# Patient Record
Sex: Male | Born: 1965 | Race: White | Hispanic: No | Marital: Married | Smoking: Never smoker
Health system: Southern US, Community
[De-identification: ages and names within clinical notes are randomized; demographics above are authoritative.]

## PROBLEM LIST (undated history)

## (undated) DIAGNOSIS — G4730 Sleep apnea, unspecified: Secondary | ICD-10-CM

## (undated) DIAGNOSIS — F32A Depression, unspecified: Secondary | ICD-10-CM

## (undated) DIAGNOSIS — F329 Major depressive disorder, single episode, unspecified: Secondary | ICD-10-CM

## (undated) HISTORY — DX: Depression, unspecified: F32.A

## (undated) HISTORY — DX: Major depressive disorder, single episode, unspecified: F32.9

## (undated) HISTORY — DX: Sleep apnea, unspecified: G47.30

## (undated) HISTORY — PX: NO PAST SURGERIES: SHX2092

---

## 1999-12-05 ENCOUNTER — Encounter: Payer: Self-pay | Admitting: Emergency Medicine

## 1999-12-05 ENCOUNTER — Emergency Department (HOSPITAL_COMMUNITY): Admission: EM | Admit: 1999-12-05 | Discharge: 1999-12-05 | Payer: Self-pay | Admitting: Emergency Medicine

## 2006-10-22 ENCOUNTER — Emergency Department (HOSPITAL_COMMUNITY): Admission: EM | Admit: 2006-10-22 | Discharge: 2006-10-22 | Payer: Self-pay | Admitting: Emergency Medicine

## 2015-10-24 DIAGNOSIS — J111 Influenza due to unidentified influenza virus with other respiratory manifestations: Secondary | ICD-10-CM | POA: Diagnosis not present

## 2015-10-30 DIAGNOSIS — J111 Influenza due to unidentified influenza virus with other respiratory manifestations: Secondary | ICD-10-CM | POA: Diagnosis not present

## 2015-10-30 DIAGNOSIS — J209 Acute bronchitis, unspecified: Secondary | ICD-10-CM | POA: Diagnosis not present

## 2015-12-19 DIAGNOSIS — G4733 Obstructive sleep apnea (adult) (pediatric): Secondary | ICD-10-CM | POA: Diagnosis not present

## 2015-12-28 DIAGNOSIS — E663 Overweight: Secondary | ICD-10-CM | POA: Diagnosis not present

## 2015-12-28 DIAGNOSIS — G4733 Obstructive sleep apnea (adult) (pediatric): Secondary | ICD-10-CM | POA: Diagnosis not present

## 2015-12-28 DIAGNOSIS — F329 Major depressive disorder, single episode, unspecified: Secondary | ICD-10-CM | POA: Diagnosis not present

## 2015-12-28 DIAGNOSIS — E785 Hyperlipidemia, unspecified: Secondary | ICD-10-CM | POA: Diagnosis not present

## 2016-02-06 DIAGNOSIS — M542 Cervicalgia: Secondary | ICD-10-CM | POA: Diagnosis not present

## 2016-02-06 DIAGNOSIS — M9903 Segmental and somatic dysfunction of lumbar region: Secondary | ICD-10-CM | POA: Diagnosis not present

## 2016-02-06 DIAGNOSIS — M545 Low back pain: Secondary | ICD-10-CM | POA: Diagnosis not present

## 2016-02-06 DIAGNOSIS — M9902 Segmental and somatic dysfunction of thoracic region: Secondary | ICD-10-CM | POA: Diagnosis not present

## 2016-04-18 DIAGNOSIS — M542 Cervicalgia: Secondary | ICD-10-CM | POA: Diagnosis not present

## 2016-04-18 DIAGNOSIS — M9902 Segmental and somatic dysfunction of thoracic region: Secondary | ICD-10-CM | POA: Diagnosis not present

## 2016-04-18 DIAGNOSIS — M545 Low back pain: Secondary | ICD-10-CM | POA: Diagnosis not present

## 2016-04-18 DIAGNOSIS — M9903 Segmental and somatic dysfunction of lumbar region: Secondary | ICD-10-CM | POA: Diagnosis not present

## 2016-04-19 DIAGNOSIS — M9903 Segmental and somatic dysfunction of lumbar region: Secondary | ICD-10-CM | POA: Diagnosis not present

## 2016-04-19 DIAGNOSIS — M545 Low back pain: Secondary | ICD-10-CM | POA: Diagnosis not present

## 2016-04-19 DIAGNOSIS — M542 Cervicalgia: Secondary | ICD-10-CM | POA: Diagnosis not present

## 2016-04-19 DIAGNOSIS — M9902 Segmental and somatic dysfunction of thoracic region: Secondary | ICD-10-CM | POA: Diagnosis not present

## 2016-05-03 DIAGNOSIS — Z23 Encounter for immunization: Secondary | ICD-10-CM | POA: Diagnosis not present

## 2016-05-03 DIAGNOSIS — Z0000 Encounter for general adult medical examination without abnormal findings: Secondary | ICD-10-CM | POA: Diagnosis not present

## 2016-05-03 DIAGNOSIS — Z1211 Encounter for screening for malignant neoplasm of colon: Secondary | ICD-10-CM | POA: Diagnosis not present

## 2016-05-03 DIAGNOSIS — Z125 Encounter for screening for malignant neoplasm of prostate: Secondary | ICD-10-CM | POA: Diagnosis not present

## 2016-05-15 DIAGNOSIS — M545 Low back pain: Secondary | ICD-10-CM | POA: Diagnosis not present

## 2016-05-15 DIAGNOSIS — M9902 Segmental and somatic dysfunction of thoracic region: Secondary | ICD-10-CM | POA: Diagnosis not present

## 2016-05-15 DIAGNOSIS — M9903 Segmental and somatic dysfunction of lumbar region: Secondary | ICD-10-CM | POA: Diagnosis not present

## 2016-05-15 DIAGNOSIS — M542 Cervicalgia: Secondary | ICD-10-CM | POA: Diagnosis not present

## 2016-05-17 DIAGNOSIS — F329 Major depressive disorder, single episode, unspecified: Secondary | ICD-10-CM | POA: Diagnosis not present

## 2016-05-17 DIAGNOSIS — E785 Hyperlipidemia, unspecified: Secondary | ICD-10-CM | POA: Diagnosis not present

## 2016-05-22 DIAGNOSIS — K219 Gastro-esophageal reflux disease without esophagitis: Secondary | ICD-10-CM | POA: Diagnosis not present

## 2016-05-25 DIAGNOSIS — G4733 Obstructive sleep apnea (adult) (pediatric): Secondary | ICD-10-CM | POA: Diagnosis not present

## 2016-06-04 DIAGNOSIS — D124 Benign neoplasm of descending colon: Secondary | ICD-10-CM | POA: Diagnosis not present

## 2016-06-04 DIAGNOSIS — K5730 Diverticulosis of large intestine without perforation or abscess without bleeding: Secondary | ICD-10-CM | POA: Diagnosis not present

## 2016-06-04 DIAGNOSIS — Z1211 Encounter for screening for malignant neoplasm of colon: Secondary | ICD-10-CM | POA: Diagnosis not present

## 2016-06-04 DIAGNOSIS — D122 Benign neoplasm of ascending colon: Secondary | ICD-10-CM | POA: Diagnosis not present

## 2016-07-10 DIAGNOSIS — D126 Benign neoplasm of colon, unspecified: Secondary | ICD-10-CM | POA: Diagnosis not present

## 2016-07-10 DIAGNOSIS — R1031 Right lower quadrant pain: Secondary | ICD-10-CM | POA: Diagnosis not present

## 2016-07-11 DIAGNOSIS — R1011 Right upper quadrant pain: Secondary | ICD-10-CM | POA: Diagnosis not present

## 2016-07-11 DIAGNOSIS — R109 Unspecified abdominal pain: Secondary | ICD-10-CM | POA: Diagnosis not present

## 2016-09-27 DIAGNOSIS — E785 Hyperlipidemia, unspecified: Secondary | ICD-10-CM | POA: Diagnosis not present

## 2016-09-27 DIAGNOSIS — F329 Major depressive disorder, single episode, unspecified: Secondary | ICD-10-CM | POA: Diagnosis not present

## 2016-10-14 DIAGNOSIS — M545 Low back pain: Secondary | ICD-10-CM | POA: Diagnosis not present

## 2016-10-14 DIAGNOSIS — M9905 Segmental and somatic dysfunction of pelvic region: Secondary | ICD-10-CM | POA: Diagnosis not present

## 2016-10-14 DIAGNOSIS — M9903 Segmental and somatic dysfunction of lumbar region: Secondary | ICD-10-CM | POA: Diagnosis not present

## 2016-10-14 DIAGNOSIS — M436 Torticollis: Secondary | ICD-10-CM | POA: Diagnosis not present

## 2016-11-29 DIAGNOSIS — G4733 Obstructive sleep apnea (adult) (pediatric): Secondary | ICD-10-CM | POA: Diagnosis not present

## 2017-04-21 DIAGNOSIS — Z23 Encounter for immunization: Secondary | ICD-10-CM | POA: Diagnosis not present

## 2017-06-07 ENCOUNTER — Emergency Department (HOSPITAL_COMMUNITY): Payer: BLUE CROSS/BLUE SHIELD

## 2017-06-07 ENCOUNTER — Encounter (HOSPITAL_COMMUNITY): Payer: Self-pay

## 2017-06-07 ENCOUNTER — Emergency Department (HOSPITAL_COMMUNITY)
Admission: EM | Admit: 2017-06-07 | Discharge: 2017-06-07 | Disposition: A | Discharge status: Home / Self Care | Payer: BLUE CROSS/BLUE SHIELD | Attending: Emergency Medicine | Admitting: Emergency Medicine

## 2017-06-07 ENCOUNTER — Other Ambulatory Visit: Payer: Self-pay

## 2017-06-07 DIAGNOSIS — R0600 Dyspnea, unspecified: Secondary | ICD-10-CM | POA: Diagnosis not present

## 2017-06-07 DIAGNOSIS — R0789 Other chest pain: Secondary | ICD-10-CM | POA: Diagnosis not present

## 2017-06-07 DIAGNOSIS — R0602 Shortness of breath: Secondary | ICD-10-CM | POA: Diagnosis not present

## 2017-06-07 LAB — BASIC METABOLIC PANEL
ANION GAP: 6 (ref 5–15)
BUN: 19 mg/dL (ref 6–20)
CO2: 29 mmol/L (ref 22–32)
Calcium: 9.6 mg/dL (ref 8.9–10.3)
Chloride: 101 mmol/L (ref 101–111)
Creatinine, Ser: 0.77 mg/dL (ref 0.61–1.24)
GFR calc Af Amer: >60 mL/min (ref >60)
GLUCOSE: 92 mg/dL (ref 65–99)
POTASSIUM: 3.7 mmol/L (ref 3.5–5.1)
SODIUM: 136 mmol/L (ref 135–145)

## 2017-06-07 LAB — CBC
HEMATOCRIT: 45.9 % (ref 39.0–52.0)
HEMOGLOBIN: 15.9 g/dL (ref 13.0–17.0)
MCH: 30.2 pg (ref 26.0–34.0)
MCHC: 34.6 g/dL (ref 30.0–36.0)
MCV: 87.1 fL (ref 78.0–100.0)
Platelets: 175 10*3/uL (ref 150–400)
RBC: 5.27 MIL/uL (ref 4.22–5.81)
RDW: 12.3 % (ref 11.5–15.5)
WBC: 5.9 10*3/uL (ref 4.0–10.5)

## 2017-06-07 LAB — I-STAT TROPONIN, ED: Troponin i, poc: 0 ng/mL (ref 0.00–0.08)

## 2017-06-07 MED ORDER — ALBUTEROL SULFATE HFA 108 (90 BASE) MCG/ACT IN AERS: 1.0000 | INHALATION_SPRAY | Freq: Four times a day (QID) | RESPIRATORY_TRACT | 0 refills | Status: DC | PRN

## 2017-06-07 MED ORDER — FLUTICASONE PROPIONATE HFA 110 MCG/ACT IN AERO: 1.0000 | INHALATION_SPRAY | Freq: Two times a day (BID) | RESPIRATORY_TRACT | 0 refills | Status: DC

## 2017-06-07 NOTE — ED Triage Notes (Signed)
Pt arrived POV c/o SOB x4 weeks intermittently and general chest tightness

## 2017-06-07 NOTE — ED Provider Notes (Signed)
[facility]ERGENCY [facility]MENT [facility]   CSN: [id number] Arrival date & time: 06/07/17  1639     History   Chief Complaint Chief Complaint  Patient presents with  . Shortness of Breath  . Chest Pain    HPI Anthony Crane is a 51 y.o. male. Chief complaint is difficulty getting a full breath.  HPI 51 year old male. Otherwise healthy. States for the last 1 month he has felt like he has difficulty "getting a full breath". He does not describe shortness of breath or air hunger. States that either at rest or with exertion but "all the time" he felt like he cannot fully get a deep breath in or out. He does limit his exertion but is present 24 hours a day. No cough. Normal wheezing. He does feel like there is a "constant tightness". No edema. No recent prolonged immobilization cast splints fractures surgeries malignancies or DVT or PE risk. No history of hypertension diabetes high cholesterol, lifetime nonsmoker, no family history of heart disease. No exertional or rest pain.  History of pectus excavatum. Sleep apnea, wears C Pap. No issues with his CPAP. Cleans it "religiously".  History reviewed. No pertinent past medical history.  There are no active problems to display for this patient.   History reviewed. No pertinent surgical history.     Home Medications    Prior to Admission medications   Medication Sig Start Date End Date Taking? Authorizing Provider  albuterol (PROVENTIL HFA;VENTOLIN HFA) 108 (90 Base) MCG/ACT inhaler Inhale 1-2 puffs every 6 (six) hours as needed into the lungs for wheezing. 06/07/17   Rolland PorterJames, Zamiyah Resendes, MD  fluticasone (FLOVENT HFA) 110 MCG/ACT inhaler Inhale 1 puff 2 (two) times daily into the lungs. 06/07/17   Rolland PorterJames, Priscila Bean, MD    Family History History reviewed. No pertinent family history.  Social History Social History   Tobacco Use  . Smoking status: Never Smoker  . Smokeless tobacco: Never Used  Substance Use Topics   . Alcohol use: No    Frequency: Never  . Drug use: No     Allergies   Hydrocodone   Review of Systems Review of Systems  Constitutional: Negative for appetite change, chills, diaphoresis, fatigue and fever.  HENT: Negative for mouth sores, sore throat and trouble swallowing.   Eyes: Negative for visual disturbance.  Respiratory: Positive for chest tightness and shortness of breath. Negative for cough and wheezing.   Cardiovascular: Negative for chest pain.  Gastrointestinal: Negative for abdominal distention, abdominal pain, diarrhea, nausea and vomiting.  Endocrine: Negative for polydipsia, polyphagia and polyuria.  Genitourinary: Negative for dysuria, frequency and hematuria.  Musculoskeletal: Negative for gait problem.  Skin: Negative for color change, pallor and rash.  Neurological: Negative for dizziness, syncope, light-headedness and headaches.  Hematological: Does not bruise/bleed easily.  Psychiatric/Behavioral: Negative for behavioral problems and confusion.     Physical Exam Updated Vital Signs BP (!) 144/93 (BP Location: Right Arm)   Pulse 64   Temp (!) 97.5 F (36.4 C) (Oral)   Resp 18   Ht 6' (1.829 m)   Wt 98.9 kg (218 lb)   SpO2 96%   BMI 29.57 kg/m   Physical Exam  Constitutional: He is oriented to person, place, and time. He appears well-developed and well-nourished. No distress.  HENT:  Head: Normocephalic.  Eyes: Conjunctivae are normal. Pupils are equal, round, and reactive to light. No scleral icterus.  Neck: Normal range of motion. Neck supple. No thyromegaly present.  Cardiovascular: Normal  rate and regular rhythm. Exam reveals no gallop and no friction rub.  No murmur heard. No gallop. No bruits. No murmurs. No peripheral edema. No asymmetric swelling of extremities. No cording.  Pulmonary/Chest: Effort normal and breath sounds normal. No respiratory distress. He has no wheezes. He has no rales.  Pectus excavatum. Clear lungs.  Abdominal:  Soft. Bowel sounds are normal. He exhibits no distension. There is no tenderness. There is no rebound.  Musculoskeletal: Normal range of motion.  Neurological: He is alert and oriented to person, place, and time.  Skin: Skin is warm and dry. No rash noted.  Psychiatric: He has a normal mood and affect. His behavior is normal.     ED Treatments / Results  Labs (all labs ordered are listed, but only abnormal results are displayed) Labs Reviewed  BASIC METABOLIC PANEL  CBC  I-STAT TROPONIN, ED    EKG  EKG Interpretation None       Radiology Dg Chest 2 View  Result Date: 06/07/2017 CLINICAL DATA:  Dyspnea x4 weeks in general chest tightness. EXAM: CHEST  2 VIEW COMPARISON:  12/05/1999 report FINDINGS: Pectus excavatum configuration of the anterior chest wall. No pneumonic consolidation, CHF, effusion or pneumothorax. Mild attenuation of the interstitial lung markings may reflect a component of hyperinflation and/or emphysema. IMPRESSION: 1. Pectus excavatum. 2. No active pulmonary disease. 3. Mild diffuse attenuation of the interstitial lung markings may represent emphysematous hyperinflation of the lungs. Electronically Signed   By: Tollie Ethavid  Kwon M.D.   On: 06/07/2017 17:11    Procedures Procedures (including critical care time)  Medications Ordered in ED Medications - No data to display   Initial Impression / Assessment and Plan / ED Course  I have reviewed the triage vital signs and the nursing notes.  Pertinent labs & imaging results that were available during my care of the patient were reviewed by me and considered in my medical decision making (see chart for details).   heart score of 1. Normal EKG and enzyme. No risks for DVT or PE. Is not hypoxemic or tachycardic. His symptoms sound like bronchospasm. He may be having Summers in shifted lung disease related to his 20 pound weight gain, perhaps from spasm, and his pectus excavatum. Referred back to his primary care for  palmar function testing. We'll try Flovent and broken dilators. Given lobe hour pulmonary if needed should his primary care request PFTs were formal evaluation.  Final Clinical Impressions(s) / ED Diagnoses   Final diagnoses:  Dyspnea, unspecified type    ED Discharge Orders        Ordered    albuterol (PROVENTIL HFA;VENTOLIN HFA) 108 (90 Base) MCG/ACT inhaler  Every 6 hours PRN     06/07/17 1950    fluticasone (FLOVENT HFA) 110 MCG/ACT inhaler  2 times daily     06/07/17 1950       Rolland PorterJames, Daylen Hack, MD 06/07/17 [phone]

## 2017-06-07 NOTE — Discharge Instructions (Signed)
Flovent inhaler twice per day. Albuterol inhaler 3 times per day and every 4 hours as needed. Follow-up with your primary care physician. If pulmonary referral is recommended by your primary care physician., this is included in these instructions.

## 2017-06-19 DIAGNOSIS — M9903 Segmental and somatic dysfunction of lumbar region: Secondary | ICD-10-CM | POA: Diagnosis not present

## 2017-06-19 DIAGNOSIS — M436 Torticollis: Secondary | ICD-10-CM | POA: Diagnosis not present

## 2017-06-19 DIAGNOSIS — M545 Low back pain: Secondary | ICD-10-CM | POA: Diagnosis not present

## 2017-06-19 DIAGNOSIS — M9905 Segmental and somatic dysfunction of pelvic region: Secondary | ICD-10-CM | POA: Diagnosis not present

## 2017-08-04 DIAGNOSIS — H5213 Myopia, bilateral: Secondary | ICD-10-CM | POA: Diagnosis not present

## 2017-09-09 DIAGNOSIS — M545 Low back pain: Secondary | ICD-10-CM | POA: Diagnosis not present

## 2017-09-09 DIAGNOSIS — M9905 Segmental and somatic dysfunction of pelvic region: Secondary | ICD-10-CM | POA: Diagnosis not present

## 2017-09-09 DIAGNOSIS — M9903 Segmental and somatic dysfunction of lumbar region: Secondary | ICD-10-CM | POA: Diagnosis not present

## 2017-09-09 DIAGNOSIS — M436 Torticollis: Secondary | ICD-10-CM | POA: Diagnosis not present

## 2017-09-10 DIAGNOSIS — M545 Low back pain: Secondary | ICD-10-CM | POA: Diagnosis not present

## 2017-09-10 DIAGNOSIS — M9903 Segmental and somatic dysfunction of lumbar region: Secondary | ICD-10-CM | POA: Diagnosis not present

## 2017-09-10 DIAGNOSIS — M9905 Segmental and somatic dysfunction of pelvic region: Secondary | ICD-10-CM | POA: Diagnosis not present

## 2017-09-10 DIAGNOSIS — M436 Torticollis: Secondary | ICD-10-CM | POA: Diagnosis not present

## 2017-09-11 DIAGNOSIS — M545 Low back pain: Secondary | ICD-10-CM | POA: Diagnosis not present

## 2017-09-11 DIAGNOSIS — M9905 Segmental and somatic dysfunction of pelvic region: Secondary | ICD-10-CM | POA: Diagnosis not present

## 2017-09-11 DIAGNOSIS — M436 Torticollis: Secondary | ICD-10-CM | POA: Diagnosis not present

## 2017-09-11 DIAGNOSIS — M9903 Segmental and somatic dysfunction of lumbar region: Secondary | ICD-10-CM | POA: Diagnosis not present

## 2017-12-03 IMAGING — DX DG CHEST 2V
2 series · 2 of 2 positions shown · non-contrast
Comparison: 12/05/1999 report

CLINICAL DATA: Dyspnea x4 weeks in general chest tightness.

EXAM:
CHEST  2 VIEW

[chest lat]
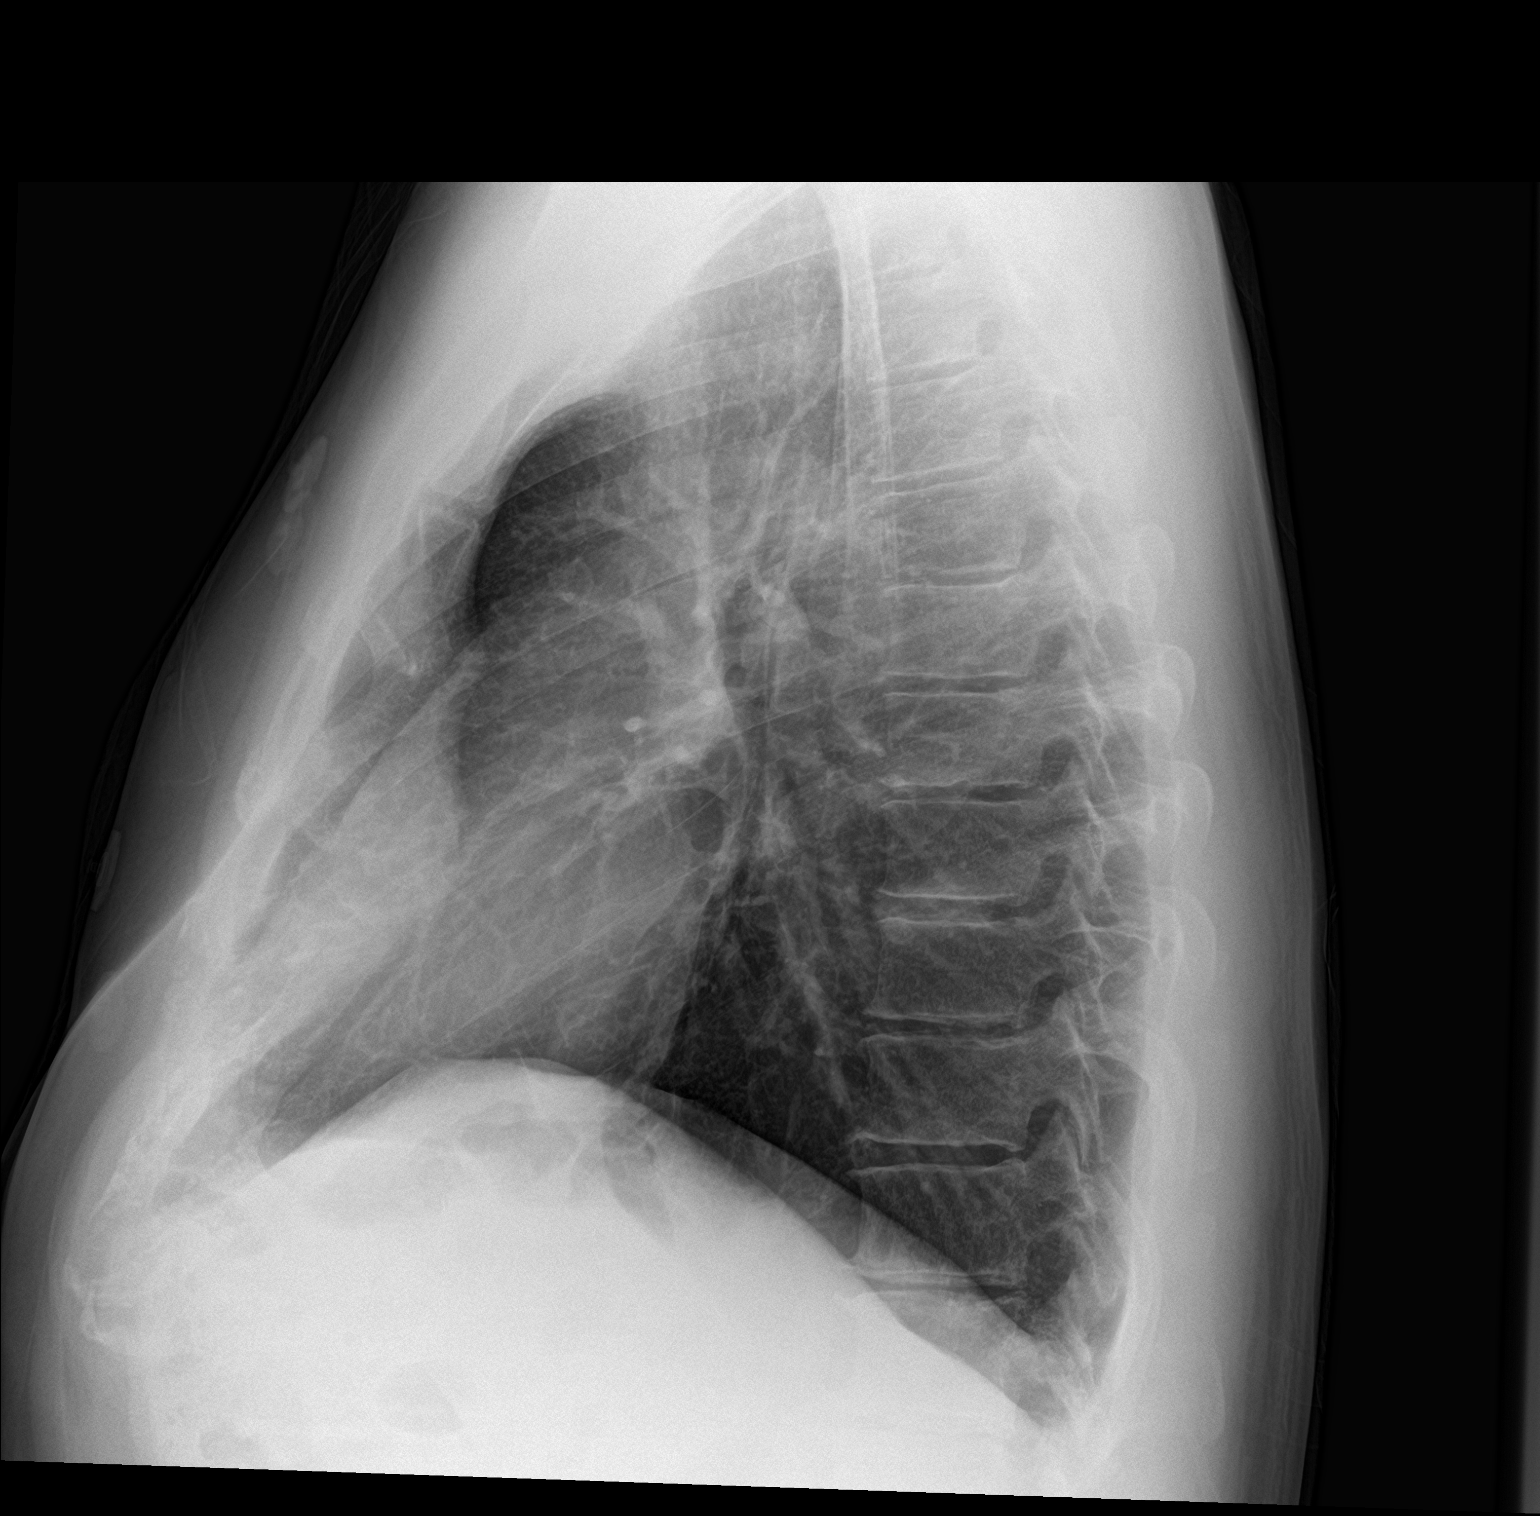

[chest pa]
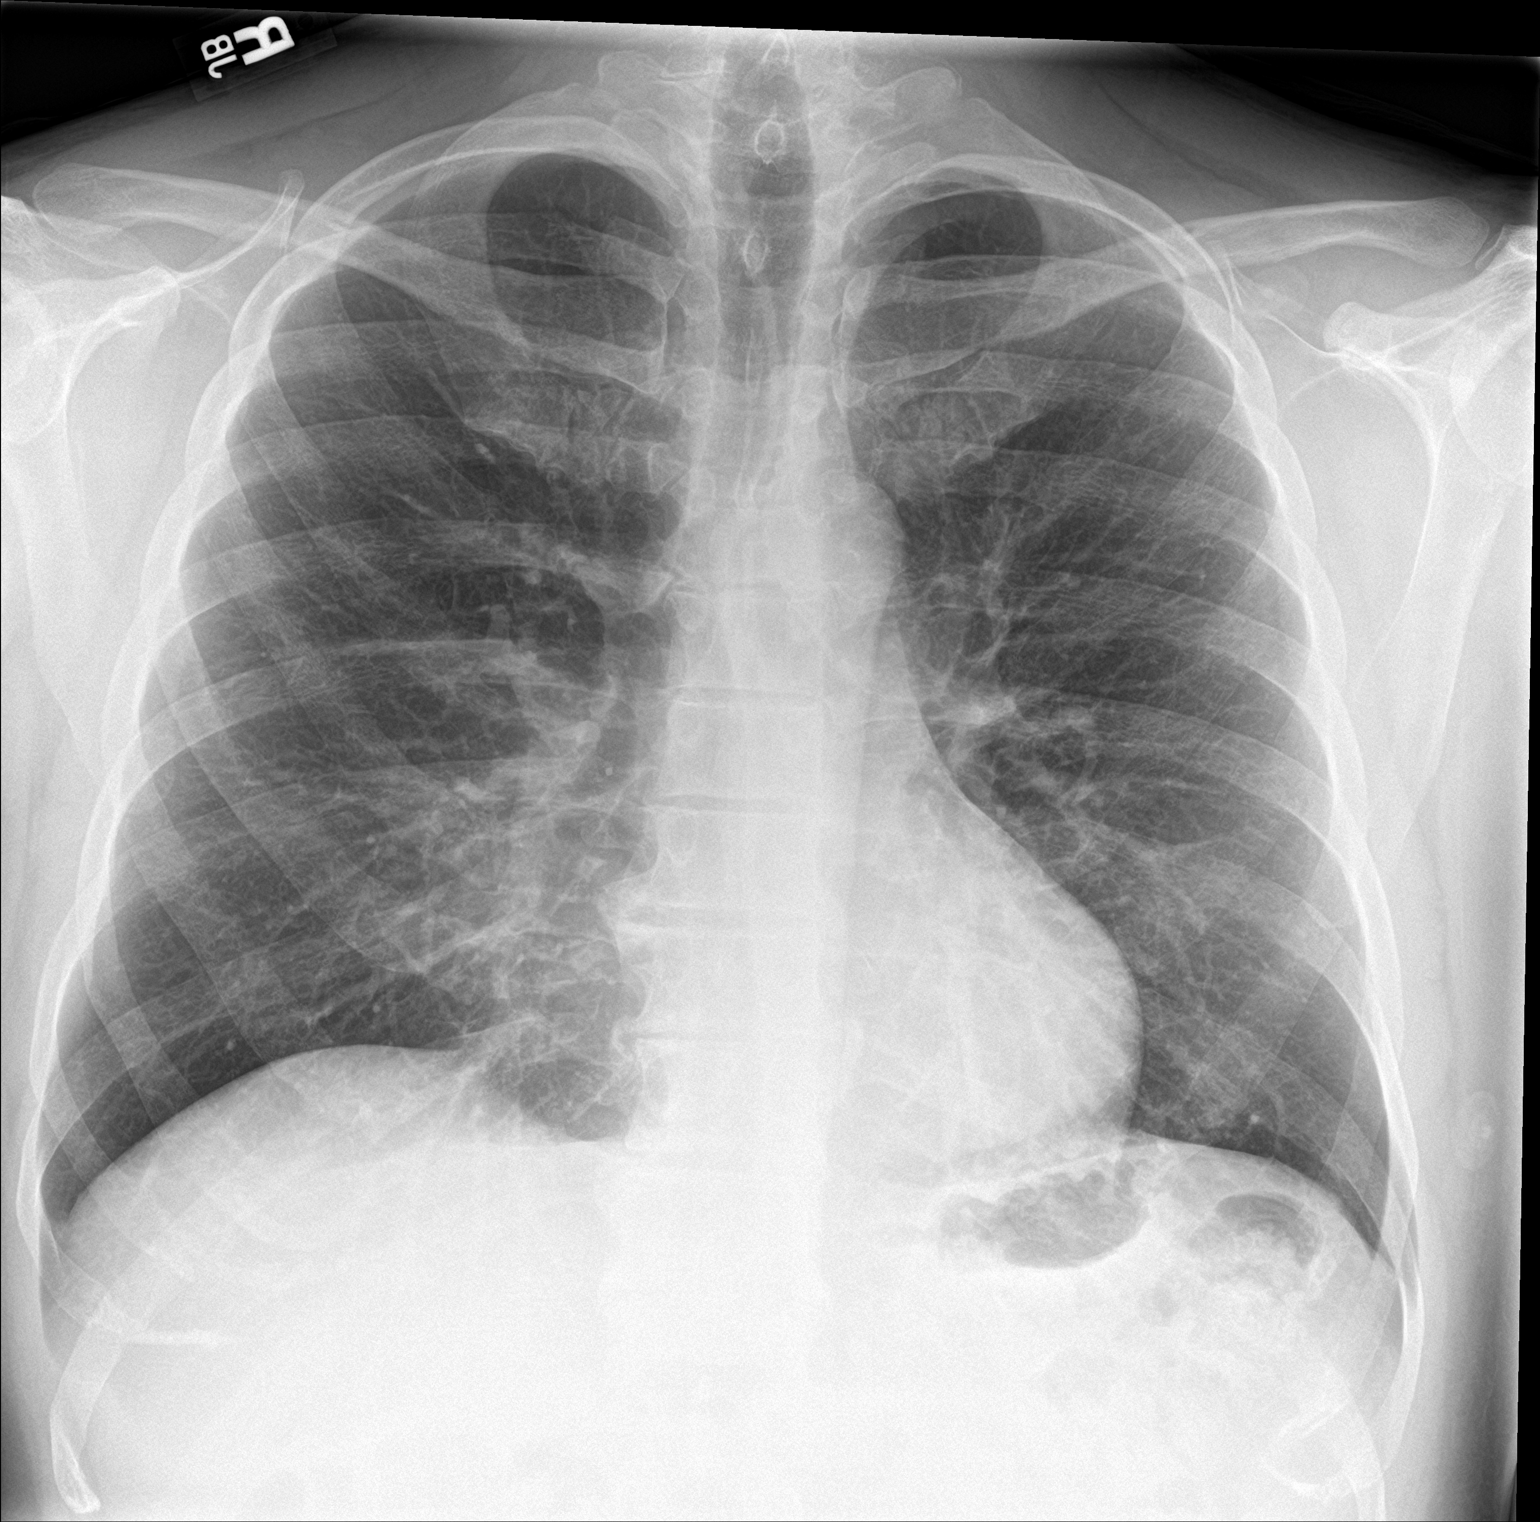

[2 of 2 positions shown; findings below may reference images not displayed]

FINDINGS: Pectus excavatum configuration of the anterior chest wall. No
pneumonic consolidation, CHF, effusion or pneumothorax. Mild
attenuation of the interstitial lung markings may reflect a
component of hyperinflation and/or emphysema.
IMPRESSION: 1. Pectus excavatum.
2. No active pulmonary disease.
3. Mild diffuse attenuation of the interstitial lung markings may
represent emphysematous hyperinflation of the lungs.

## 2017-12-24 DIAGNOSIS — G4733 Obstructive sleep apnea (adult) (pediatric): Secondary | ICD-10-CM | POA: Diagnosis not present

## 2018-03-25 DIAGNOSIS — L820 Inflamed seborrheic keratosis: Secondary | ICD-10-CM | POA: Diagnosis not present

## 2018-03-25 DIAGNOSIS — D225 Melanocytic nevi of trunk: Secondary | ICD-10-CM | POA: Diagnosis not present

## 2018-03-25 DIAGNOSIS — L814 Other melanin hyperpigmentation: Secondary | ICD-10-CM | POA: Diagnosis not present

## 2018-03-25 DIAGNOSIS — L578 Other skin changes due to chronic exposure to nonionizing radiation: Secondary | ICD-10-CM | POA: Diagnosis not present

## 2018-03-25 DIAGNOSIS — L309 Dermatitis, unspecified: Secondary | ICD-10-CM | POA: Diagnosis not present

## 2018-05-04 DIAGNOSIS — Z6831 Body mass index (BMI) 31.0-31.9, adult: Secondary | ICD-10-CM | POA: Diagnosis not present

## 2018-05-04 DIAGNOSIS — F419 Anxiety disorder, unspecified: Secondary | ICD-10-CM | POA: Diagnosis not present

## 2018-05-04 DIAGNOSIS — Z1331 Encounter for screening for depression: Secondary | ICD-10-CM | POA: Diagnosis not present

## 2018-05-04 DIAGNOSIS — Z23 Encounter for immunization: Secondary | ICD-10-CM | POA: Diagnosis not present

## 2018-08-14 DIAGNOSIS — M25461 Effusion, right knee: Secondary | ICD-10-CM | POA: Diagnosis not present

## 2018-08-14 DIAGNOSIS — M25561 Pain in right knee: Secondary | ICD-10-CM | POA: Diagnosis not present

## 2018-08-14 DIAGNOSIS — M25469 Effusion, unspecified knee: Secondary | ICD-10-CM | POA: Diagnosis not present

## 2018-08-18 ENCOUNTER — Encounter: Payer: BLUE CROSS/BLUE SHIELD | Admitting: Sports Medicine

## 2018-08-25 DIAGNOSIS — M25561 Pain in right knee: Secondary | ICD-10-CM | POA: Diagnosis not present

## 2018-08-28 DIAGNOSIS — M25561 Pain in right knee: Secondary | ICD-10-CM | POA: Diagnosis not present

## 2018-09-01 DIAGNOSIS — M25561 Pain in right knee: Secondary | ICD-10-CM | POA: Diagnosis not present

## 2018-09-01 DIAGNOSIS — M1711 Unilateral primary osteoarthritis, right knee: Secondary | ICD-10-CM | POA: Diagnosis not present

## 2018-09-08 DIAGNOSIS — M1711 Unilateral primary osteoarthritis, right knee: Secondary | ICD-10-CM | POA: Diagnosis not present

## 2018-09-18 ENCOUNTER — Ambulatory Visit (INDEPENDENT_AMBULATORY_CARE_PROVIDER_SITE_OTHER): Payer: BLUE CROSS/BLUE SHIELD | Admitting: Sports Medicine

## 2018-09-18 ENCOUNTER — Encounter: Payer: Self-pay | Admitting: Sports Medicine

## 2018-09-18 DIAGNOSIS — M1711 Unilateral primary osteoarthritis, right knee: Secondary | ICD-10-CM | POA: Diagnosis not present

## 2018-09-18 MED ORDER — CELECOXIB 200 MG PO CAPS
ORAL_CAPSULE | ORAL | 2 refills | Status: DC
Start: 2018-09-18 — End: 2019-01-10

## 2018-09-18 MED ORDER — TRAMADOL HCL 50 MG PO TABS: 50.0000 mg | ORAL_TABLET | Freq: Three times a day (TID) | ORAL | 0 refills | Status: DC | PRN

## 2018-09-18 NOTE — Assessment & Plan Note (Signed)
With degenerative meniscal tearing and a full-thickness focal area of cartilage loss of the medial femoral condyle. Previous injection did not help. We are going to repeated today, aspiration and injection but with ultrasound guidance. I am going to get him set up for Visco supplementation. Switching from ibuprofen to Celebrex and adding tramadol for breakthrough pain. Return to start viscosupplementation when approved.

## 2018-09-18 NOTE — Progress Notes (Signed)
Subjective:    CC: Right knee pain  HPI:  This is a pleasant 53 year old male, for some time now he has had pain that he localizes in his right knee, medial joint line, anteriorly.  No overt mechanical symptoms.  He was seen by [place] [facility], he was given an aspiration and injection, crystal analysis was negative, ultimately he continued to have pain, MRI was obtained that showed medial meniscal tearing, as well as full-thickness focal cartilage loss of the medial femoral condyle.  He is interested in additional options.  Using ibuprofen 800.  I reviewed the past medical history, family history, social history, surgical history, and allergies today and no changes were needed.  Please see the problem list section below in epic for further details.  Past Medical History: No past medical history on file. Past Surgical History: No past surgical history on file. Social History: Social History   Socioeconomic History  . Marital status: Married    Spouse name: Not on file  . Number of children: Not on file  . Years of education: Not on file  . Highest education level: Not on file  Occupational History  . Not on file  Social Needs  . Financial resource strain: Not on file  . Food insecurity:    Worry: Not on file    Inability: Not on file  . Transportation needs:    Medical: Not on file    Non-medical: Not on file  Tobacco Use  . Smoking status: Never Smoker  . Smokeless tobacco: Never Used  Substance and Sexual Activity  . Alcohol use: No    Frequency: Never  . Drug use: No  . Sexual activity: Not on file  Lifestyle  . Physical activity:    Days per week: Not on file    Minutes per session: Not on file  . Stress: Not on file  Relationships  . Social connections:    Talks on phone: Not on file    Gets together: Not on file    Attends religious service: Not on file    Active member of club or organization: Not on file    Attends meetings of clubs or organizations:  Not on file    Relationship status: Not on file  Other Topics Concern  . Not on file  Social History Narrative  . Not on file   Family History: No family history on file. Allergies: Allergies  Allergen Reactions  . Hydrocodone Itching   Medications: See med rec.  Review of Systems: No headache, visual changes, nausea, vomiting, diarrhea, constipation, dizziness, abdominal pain, skin rash, fevers, chills, night sweats, swollen lymph nodes, weight loss, chest pain, body aches, joint swelling, muscle aches, shortness of breath, mood changes, visual or auditory hallucinations.  Objective:    General: Well Developed, well nourished, and in no acute distress.  Neuro: Alert and oriented x3, extra-ocular muscles intact, sensation grossly intact.  HEENT: Normocephalic, atraumatic, pupils equal round reactive to light, neck supple, no masses, no lymphadenopathy, thyroid nonpalpable.  Skin: Warm and dry, no rashes noted.  Cardiac: Regular rate and rhythm, no murmurs rubs or gallops.  Respiratory: Clear to auscultation bilaterally. Not using accessory muscles, speaking in full sentences.  Abdominal: Soft, nontender, nondistended, positive bowel sounds, no masses, no organomegaly.  Right knee: Visibly swollen, palpable fluid wave, tenderness the medial joint line and the medial femoral condyle ROM normal in flexion and extension and lower leg rotation. Ligaments with solid consistent endpoints including ACL, PCL, LCL, MCL. Negative  Mcmurray's and provocative meniscal tests. Non painful patellar compression. Patellar and quadriceps tendons unremarkable. Hamstring and quadriceps strength is normal.  Procedure: Real-time Ultrasound Guided  aspiration/injection of right knee Device: GE Logiq E  Verbal informed consent obtained.  Time-out conducted.  Noted no overlying erythema, induration, or other signs of local infection.  Skin prepped in a sterile fashion.  Local anesthesia: Topical Ethyl  chloride.  With sterile technique and under real time ultrasound guidance:  Using a template needle aspirated 50 cc of clear, straw-colored fluid, syringe switched and 1 cc Kenalog 40, 2 cc lidocaine, 2 cc bupivacaine injected easily Completed without difficulty  Pain immediately resolved suggesting accurate placement of the medication.  Advised to call if fevers/chills, erythema, induration, drainage, or persistent bleeding.  Images permanently stored and available for review in the ultrasound unit.  Impression: Technically successful ultrasound guided injection.  Impression and Recommendations:    The patient was counselled, risk factors were discussed, anticipatory guidance given.  Primary osteoarthritis of right knee With degenerative meniscal tearing and a full-thickness focal area of cartilage loss of the medial femoral condyle. Previous injection did not help. We are going to repeated today, aspiration and injection but with ultrasound guidance. I am going to get him set up for Visco supplementation. Switching from ibuprofen to Celebrex and adding tramadol for breakthrough pain. Return to start viscosupplementation when approved.  ___________________________________________ Ihor Austin. Benjamin Stain, M.D., ABFM., CAQSM. Primary Care and Sports Medicine [facility]  Adjunct Professor of Family Medicine  [employer]

## 2018-09-21 ENCOUNTER — Telehealth: Payer: Self-pay | Admitting: Sports Medicine

## 2018-09-21 NOTE — Telephone Encounter (Signed)
-----   Message from Neldon Labella, [place] sent at 09/21/2018 11:51 AM EST ----- Information has been submitted to Orthovisc and awaiting determination.   ----- Message ----- From: Monica Becton, MD Sent: 09/18/2018   2:10 PM EST To: Neldon Labella, CMA  Orthovisc approval please, right knee, x-ray and MRI confirmed.___________________________________________Thomas J. Benjamin Stain, M.D., ABFM., CAQSM.Primary Care and Sports MedicineC[facility] MedCenter KernersvilleAdjunct Professor of Family Medicine [employer]

## 2018-09-24 NOTE — Telephone Encounter (Signed)
Orthovisc is covered. There is no copay. After the deductible has been met, the patient's responsibility will be 20% of the allowable amount. Once the out of pocket is met, the patient will have no financial responsibility. Call reference number is [id number].  Patient is agreeable to the estimated cost of the Orthovisc injections and I advised the patient  that we would bill it to his insurance and he would get a statement. Patient wanted to go ahead with the injections and the first appointment is scheduled. No further questions at this time.

## 2018-09-25 ENCOUNTER — Ambulatory Visit (INDEPENDENT_AMBULATORY_CARE_PROVIDER_SITE_OTHER): Payer: BLUE CROSS/BLUE SHIELD | Admitting: Sports Medicine

## 2018-09-25 DIAGNOSIS — M1711 Unilateral primary osteoarthritis, right knee: Secondary | ICD-10-CM | POA: Diagnosis not present

## 2018-09-25 NOTE — Assessment & Plan Note (Signed)
Aspiration and Orthovisc injection #1 of 4. Return in 1 week for #2 of 4.

## 2018-09-25 NOTE — Progress Notes (Signed)
   Procedure: Real-time Ultrasound Guided  aspiration/injection of right knee Device: GE Logiq E  Verbal informed consent obtained.  Time-out conducted.  Noted no overlying erythema, induration, or other signs of local infection.  Skin prepped in a sterile fashion.  Local anesthesia: Topical Ethyl chloride.  With sterile technique and under real time ultrasound guidance:  Using an 18-gauge needle aspirated approximately 40 cc of thick, straw-colored fluid, syringe switched and 30 mg/2 mL of OrthoVisc (sodium hyaluronate) in a prefilled syringe was injected easily into the knee. Completed without difficulty  Pain immediately resolved suggesting accurate placement of the medication.  Advised to call if fevers/chills, erythema, induration, drainage, or persistent bleeding.  Images permanently stored and available for review in the ultrasound unit.  Impression: Technically successful ultrasound guided injection.

## 2018-09-29 DIAGNOSIS — G4733 Obstructive sleep apnea (adult) (pediatric): Secondary | ICD-10-CM | POA: Diagnosis not present

## 2018-10-02 ENCOUNTER — Ambulatory Visit (INDEPENDENT_AMBULATORY_CARE_PROVIDER_SITE_OTHER): Payer: BLUE CROSS/BLUE SHIELD | Admitting: Sports Medicine

## 2018-10-02 ENCOUNTER — Other Ambulatory Visit: Payer: Self-pay

## 2018-10-02 DIAGNOSIS — M1711 Unilateral primary osteoarthritis, right knee: Secondary | ICD-10-CM

## 2018-10-02 MED ORDER — TRAMADOL HCL 50 MG PO TABS: 50.0000 mg | ORAL_TABLET | Freq: Three times a day (TID) | ORAL | 0 refills | Status: DC | PRN

## 2018-10-02 NOTE — Assessment & Plan Note (Addendum)
Arthrocentesis, this was a bit cloudy, and I did note some synovitis in the joint so adding crystal analysis and cultures. Refill tramadol, crutches, he is having significant pain with ambulation. Orthovisc No. 2 of 4 into the right knee. Return in 1 week for Orthovisc No. 3 of 4 into the right knee.

## 2018-10-02 NOTE — Addendum Note (Signed)
Addended by: Monica Becton on: 10/02/2018 01:48 PM   Modules accepted: Orders

## 2018-10-02 NOTE — Progress Notes (Addendum)
   Procedure: Real-time Ultrasound Guided  aspiration/injection of the right knee Device: GE Logiq E  Verbal informed consent obtained.  Time-out conducted.  Noted no overlying erythema, induration, or other signs of local infection.  Skin prepped in a sterile fashion.  Local anesthesia: Topical Ethyl chloride.  With sterile technique and under real time ultrasound guidance:  Aspirated 12 cc of cloudy, serosanguineous fluid, syringe switched and 3 cc lidocaine, 3 cc bupivacaine injected, syringe switched and 30 mg/2 mL of OrthoVisc (sodium hyaluronate) in a prefilled syringe was injected easily into the knee through a 18-gauge needle. Completed without difficulty  Pain immediately resolved suggesting accurate placement of the medication.  Advised to call if fevers/chills, erythema, induration, drainage, or persistent bleeding.  Images permanently stored and available for review in the ultrasound unit.  Impression: Technically successful ultrasound guided injection.     Primary osteoarthritis of right knee Arthrocentesis, this was a bit cloudy, and I did note some synovitis in the joint so adding crystal analysis and cultures. Refill tramadol, crutches, he is having significant pain with ambulation. Orthovisc No. 2 of 4 into the right knee. Return in 1 week for Orthovisc No. 3 of 4 into the right knee.

## 2018-10-05 ENCOUNTER — Encounter: Payer: Self-pay | Admitting: *Deleted

## 2018-10-05 ENCOUNTER — Other Ambulatory Visit: Payer: Self-pay | Admitting: Sports Medicine

## 2018-10-05 MED ORDER — OXYCODONE-ACETAMINOPHEN 5-325 MG PO TABS: 1.0000 | ORAL_TABLET | Freq: Three times a day (TID) | ORAL | 0 refills | Status: DC | PRN

## 2018-10-09 ENCOUNTER — Ambulatory Visit (INDEPENDENT_AMBULATORY_CARE_PROVIDER_SITE_OTHER): Payer: BLUE CROSS/BLUE SHIELD | Admitting: Sports Medicine

## 2018-10-09 ENCOUNTER — Other Ambulatory Visit: Payer: Self-pay

## 2018-10-09 DIAGNOSIS — M1711 Unilateral primary osteoarthritis, right knee: Secondary | ICD-10-CM

## 2018-10-09 LAB — ANAEROBIC AND AEROBIC CULTURE
AER RESULT:: NO GROWTH
MICRO NUMBER:: 318920
MICRO NUMBER:: 318921
SPECIMEN QUALITY:: ADEQUATE
SPECIMEN QUALITY:: ADEQUATE

## 2018-10-09 LAB — CELL COUNT AND DIFF, FLUID, OTHER
Basophils, %: 0 %
Eosinophils, %: 0 %
Lymphocytes, %: 24 %
Mesothelial, %: 0 %
Monocyte/Macrophage %: 26 %
Neutrophils, %: 50 %
Total Nucleated Cell Ct: 33 {cells}/uL

## 2018-10-09 LAB — SYNOVIAL FLUID, CRYSTAL

## 2018-10-09 MED ORDER — HYDROMORPHONE HCL 2 MG PO TABS: 2.0000 mg | ORAL_TABLET | Freq: Three times a day (TID) | ORAL | 0 refills | Status: DC | PRN

## 2018-10-09 NOTE — Progress Notes (Signed)
   Procedure: Real-time Ultrasound Guided  aspiration/injection of right knee Device: GE Logiq E  Verbal informed consent obtained.  Time-out conducted.  Noted no overlying erythema, induration, or other signs of local infection.  Skin prepped in a sterile fashion.  Local anesthesia: Topical Ethyl chloride.  With sterile technique and under real time ultrasound guidance:  Using an 18-gauge needle a advanced into the suprapatellar recess, aspirated approximately 5 cc of clear yellowish fluid with whitish suspended objects.  Syringe switched and 30 mg/2 mL of OrthoVisc (sodium hyaluronate) in a prefilled syringe was injected easily into the knee. Completed without difficulty  Pain immediately resolved suggesting accurate placement of the medication.  Advised to call if fevers/chills, erythema, induration, drainage, or persistent bleeding.  Images permanently stored and available for review in the ultrasound unit.  Impression: Technically successful ultrasound guided injection.

## 2018-10-09 NOTE — Assessment & Plan Note (Addendum)
There continues to be particulate matter in the aspirate. Running another crystal analysis and cultures this time. Orthovisc No. 3 of 4 today, return in 1 week for #4, still not feeling any better. I did call in some Dilaudid, he has had multiple allergies to other narcotics, and tramadol.

## 2018-10-15 LAB — ANAEROBIC AND AEROBIC CULTURE
AER RESULT:: NO GROWTH
MICRO NUMBER:: 341137
MICRO NUMBER:: 341138
SPECIMEN QUALITY:: ADEQUATE
SPECIMEN QUALITY:: ADEQUATE

## 2018-10-15 LAB — SYNOVIAL FLUID, CRYSTAL

## 2018-10-15 LAB — CELL COUNT + DIFF, W/O CRYST-SYNVL FLD
Basophils, %: 0 %
Eosinophils-Synovial: 0 % (ref 0–2)
Lymphocytes-Synovial Fld: 24 % (ref 0–74)
Monocyte/Macrophage: 72 % — ABNORMAL HIGH (ref 0–69)
Neutrophil, Synovial: 4 % (ref 0–24)
Synoviocytes, %: 0 % (ref 0–15)
WBC, Synovial: 16 cells/uL (ref <150)

## 2018-10-16 ENCOUNTER — Ambulatory Visit (INDEPENDENT_AMBULATORY_CARE_PROVIDER_SITE_OTHER): Payer: BLUE CROSS/BLUE SHIELD | Admitting: Sports Medicine

## 2018-10-16 ENCOUNTER — Other Ambulatory Visit: Payer: Self-pay

## 2018-10-16 DIAGNOSIS — M1711 Unilateral primary osteoarthritis, right knee: Secondary | ICD-10-CM

## 2018-10-16 NOTE — Assessment & Plan Note (Signed)
As expected to starting to feel better after the third shot. About 50% better now. Fourth injection given today, cultures and crystal analyses have been negative. Return to see me in a month. He never took the Dilaudid, has switched back to tramadol and is not having any itching.

## 2018-10-16 NOTE — Progress Notes (Signed)
   Procedure: Real-time Ultrasound Guided  aspiration/injection of right knee Device: GE Logiq E  Verbal informed consent obtained.  Time-out conducted.  Noted no overlying erythema, induration, or other signs of local infection.  Skin prepped in a sterile fashion.  Local anesthesia: Topical Ethyl chloride.  With sterile technique and under real time ultrasound guidance:  Using 18-gauge needle aspirated about 20 cc of clear, amber-colored fluid, syringe switched and 30 mg/2 mL of OrthoVisc (sodium hyaluronate) in a prefilled syringe was injected easily into the knee. Completed without difficulty  Pain immediately resolved suggesting accurate placement of the medication.  Advised to call if fevers/chills, erythema, induration, drainage, or persistent bleeding.  Images permanently stored and available for review in the ultrasound unit.  Impression: Technically successful ultrasound guided injection.

## 2018-10-27 ENCOUNTER — Telehealth: Payer: Self-pay | Admitting: *Deleted

## 2018-10-27 DIAGNOSIS — M1711 Unilateral primary osteoarthritis, right knee: Secondary | ICD-10-CM

## 2018-10-27 NOTE — Telephone Encounter (Signed)
Damn, the knee is going to need an operation.  Please have him get back in with [facility] and let them know we finished injections and orthovisc and he still has pain.  If he would prefer another ortho office I'm happy to place a different referral.

## 2018-10-27 NOTE — Telephone Encounter (Signed)
Pt left vm stating that since finishing Orthovisc almost two weeks ago, his knee pain is back to where he was before we started.  He stated that he is back to limping and needing his crutch.

## 2018-10-28 NOTE — Telephone Encounter (Signed)
[facility] notifying pt of referral and requested a return call regarding pain medication.

## 2018-10-28 NOTE — Telephone Encounter (Signed)
Pt called back and would like for you to send a referral to a surgeon of YOUR choice.

## 2018-10-28 NOTE — Telephone Encounter (Signed)
Done, referral to Dr. Jodi Geralds.  He did my mother's knee replacement.  It still might be some time before Dr. Luiz Blare can do it, would Earl Lites like some pain medication?

## 2018-10-28 NOTE — Telephone Encounter (Signed)
Left brief VM about Dr. Lucienne Minks recommendations. Patient was asked to call back with any questions.

## 2018-11-02 DIAGNOSIS — M545 Low back pain: Secondary | ICD-10-CM | POA: Diagnosis not present

## 2018-11-02 DIAGNOSIS — M9903 Segmental and somatic dysfunction of lumbar region: Secondary | ICD-10-CM | POA: Diagnosis not present

## 2018-11-02 DIAGNOSIS — M542 Cervicalgia: Secondary | ICD-10-CM | POA: Diagnosis not present

## 2018-11-02 DIAGNOSIS — M9902 Segmental and somatic dysfunction of thoracic region: Secondary | ICD-10-CM | POA: Diagnosis not present

## 2018-11-10 DIAGNOSIS — M1711 Unilateral primary osteoarthritis, right knee: Secondary | ICD-10-CM | POA: Diagnosis not present

## 2018-11-10 DIAGNOSIS — M25561 Pain in right knee: Secondary | ICD-10-CM | POA: Diagnosis not present

## 2018-11-20 ENCOUNTER — Ambulatory Visit: Payer: BLUE CROSS/BLUE SHIELD | Admitting: Sports Medicine

## 2018-12-01 DIAGNOSIS — G4733 Obstructive sleep apnea (adult) (pediatric): Secondary | ICD-10-CM | POA: Diagnosis not present

## 2018-12-01 DIAGNOSIS — Z125 Encounter for screening for malignant neoplasm of prostate: Secondary | ICD-10-CM | POA: Diagnosis not present

## 2018-12-01 DIAGNOSIS — F419 Anxiety disorder, unspecified: Secondary | ICD-10-CM | POA: Diagnosis not present

## 2018-12-01 DIAGNOSIS — Z01818 Encounter for other preprocedural examination: Secondary | ICD-10-CM | POA: Diagnosis not present

## 2018-12-01 DIAGNOSIS — Z01812 Encounter for preprocedural laboratory examination: Secondary | ICD-10-CM | POA: Diagnosis not present

## 2018-12-08 ENCOUNTER — Other Ambulatory Visit: Payer: Self-pay | Admitting: Sports Medicine

## 2018-12-08 DIAGNOSIS — M1711 Unilateral primary osteoarthritis, right knee: Secondary | ICD-10-CM

## 2018-12-09 ENCOUNTER — Other Ambulatory Visit: Payer: Self-pay | Admitting: Sports Medicine

## 2018-12-09 DIAGNOSIS — M1711 Unilateral primary osteoarthritis, right knee: Secondary | ICD-10-CM

## 2018-12-09 MED ORDER — TRAMADOL HCL 50 MG PO TABS: 50.0000 mg | ORAL_TABLET | Freq: Three times a day (TID) | ORAL | 0 refills | Status: AC | PRN

## 2018-12-24 DIAGNOSIS — M1711 Unilateral primary osteoarthritis, right knee: Secondary | ICD-10-CM | POA: Diagnosis not present

## 2018-12-30 DIAGNOSIS — M1711 Unilateral primary osteoarthritis, right knee: Secondary | ICD-10-CM | POA: Diagnosis not present

## 2018-12-30 DIAGNOSIS — Z96651 Presence of right artificial knee joint: Secondary | ICD-10-CM | POA: Diagnosis not present

## 2019-01-04 DIAGNOSIS — G4733 Obstructive sleep apnea (adult) (pediatric): Secondary | ICD-10-CM | POA: Diagnosis not present

## 2019-01-10 ENCOUNTER — Other Ambulatory Visit: Payer: Self-pay | Admitting: Sports Medicine

## 2019-01-10 DIAGNOSIS — M1711 Unilateral primary osteoarthritis, right knee: Secondary | ICD-10-CM

## 2019-01-11 DIAGNOSIS — M25461 Effusion, right knee: Secondary | ICD-10-CM | POA: Diagnosis not present

## 2019-01-11 DIAGNOSIS — Z471 Aftercare following joint replacement surgery: Secondary | ICD-10-CM | POA: Diagnosis not present

## 2019-01-19 DIAGNOSIS — G4733 Obstructive sleep apnea (adult) (pediatric): Secondary | ICD-10-CM | POA: Diagnosis not present

## 2019-01-19 DIAGNOSIS — R5383 Other fatigue: Secondary | ICD-10-CM | POA: Diagnosis not present

## 2019-01-19 DIAGNOSIS — E6609 Other obesity due to excess calories: Secondary | ICD-10-CM | POA: Diagnosis not present

## 2019-01-22 DIAGNOSIS — G4733 Obstructive sleep apnea (adult) (pediatric): Secondary | ICD-10-CM | POA: Diagnosis not present

## 2019-01-27 DIAGNOSIS — E6609 Other obesity due to excess calories: Secondary | ICD-10-CM | POA: Diagnosis not present

## 2019-01-27 DIAGNOSIS — R5383 Other fatigue: Secondary | ICD-10-CM | POA: Diagnosis not present

## 2019-01-27 DIAGNOSIS — G4733 Obstructive sleep apnea (adult) (pediatric): Secondary | ICD-10-CM | POA: Diagnosis not present

## 2019-02-12 DIAGNOSIS — G4733 Obstructive sleep apnea (adult) (pediatric): Secondary | ICD-10-CM | POA: Diagnosis not present

## 2019-03-15 DIAGNOSIS — G4733 Obstructive sleep apnea (adult) (pediatric): Secondary | ICD-10-CM | POA: Diagnosis not present

## 2019-04-15 DIAGNOSIS — G4733 Obstructive sleep apnea (adult) (pediatric): Secondary | ICD-10-CM | POA: Diagnosis not present

## 2019-05-15 DIAGNOSIS — G4733 Obstructive sleep apnea (adult) (pediatric): Secondary | ICD-10-CM | POA: Diagnosis not present

## 2019-06-15 DIAGNOSIS — G4733 Obstructive sleep apnea (adult) (pediatric): Secondary | ICD-10-CM | POA: Diagnosis not present

## 2019-06-15 DIAGNOSIS — M25561 Pain in right knee: Secondary | ICD-10-CM | POA: Diagnosis not present

## 2019-06-23 DIAGNOSIS — R6889 Other general symptoms and signs: Secondary | ICD-10-CM | POA: Diagnosis not present

## 2019-06-23 DIAGNOSIS — Z20828 Contact with and (suspected) exposure to other viral communicable diseases: Secondary | ICD-10-CM | POA: Diagnosis not present

## 2019-07-15 DIAGNOSIS — G4733 Obstructive sleep apnea (adult) (pediatric): Secondary | ICD-10-CM | POA: Diagnosis not present

## 2019-08-03 DIAGNOSIS — G4733 Obstructive sleep apnea (adult) (pediatric): Secondary | ICD-10-CM | POA: Diagnosis not present

## 2019-08-15 DIAGNOSIS — G4733 Obstructive sleep apnea (adult) (pediatric): Secondary | ICD-10-CM | POA: Diagnosis not present

## 2019-09-15 DIAGNOSIS — G4733 Obstructive sleep apnea (adult) (pediatric): Secondary | ICD-10-CM | POA: Diagnosis not present

## 2019-10-13 DIAGNOSIS — G4733 Obstructive sleep apnea (adult) (pediatric): Secondary | ICD-10-CM | POA: Diagnosis not present

## 2019-11-03 DIAGNOSIS — Z136 Encounter for screening for cardiovascular disorders: Secondary | ICD-10-CM | POA: Diagnosis not present

## 2019-11-03 DIAGNOSIS — R350 Frequency of micturition: Secondary | ICD-10-CM | POA: Diagnosis not present

## 2019-11-03 DIAGNOSIS — Z125 Encounter for screening for malignant neoplasm of prostate: Secondary | ICD-10-CM | POA: Diagnosis not present

## 2019-11-03 DIAGNOSIS — F419 Anxiety disorder, unspecified: Secondary | ICD-10-CM | POA: Diagnosis not present

## 2019-11-03 DIAGNOSIS — Z0000 Encounter for general adult medical examination without abnormal findings: Secondary | ICD-10-CM | POA: Diagnosis not present

## 2019-11-13 DIAGNOSIS — G4733 Obstructive sleep apnea (adult) (pediatric): Secondary | ICD-10-CM | POA: Diagnosis not present

## 2019-12-08 DIAGNOSIS — R1011 Right upper quadrant pain: Secondary | ICD-10-CM | POA: Diagnosis not present

## 2019-12-09 DIAGNOSIS — R1011 Right upper quadrant pain: Secondary | ICD-10-CM | POA: Diagnosis not present

## 2019-12-13 DIAGNOSIS — G4733 Obstructive sleep apnea (adult) (pediatric): Secondary | ICD-10-CM | POA: Diagnosis not present

## 2019-12-14 DIAGNOSIS — R1011 Right upper quadrant pain: Secondary | ICD-10-CM | POA: Diagnosis not present

## 2019-12-14 DIAGNOSIS — R1010 Upper abdominal pain, unspecified: Secondary | ICD-10-CM | POA: Diagnosis not present

## 2019-12-29 DIAGNOSIS — R1011 Right upper quadrant pain: Secondary | ICD-10-CM | POA: Diagnosis not present

## 2020-01-13 DIAGNOSIS — G4733 Obstructive sleep apnea (adult) (pediatric): Secondary | ICD-10-CM | POA: Diagnosis not present

## 2020-01-20 DIAGNOSIS — R1010 Upper abdominal pain, unspecified: Secondary | ICD-10-CM | POA: Diagnosis not present

## 2020-01-28 DIAGNOSIS — R110 Nausea: Secondary | ICD-10-CM | POA: Diagnosis not present

## 2020-01-28 DIAGNOSIS — R1010 Upper abdominal pain, unspecified: Secondary | ICD-10-CM | POA: Diagnosis not present

## 2020-01-28 DIAGNOSIS — Q676 Pectus excavatum: Secondary | ICD-10-CM | POA: Diagnosis not present

## 2020-01-28 DIAGNOSIS — R1011 Right upper quadrant pain: Secondary | ICD-10-CM | POA: Diagnosis not present

## 2020-02-12 DIAGNOSIS — G4733 Obstructive sleep apnea (adult) (pediatric): Secondary | ICD-10-CM | POA: Diagnosis not present

## 2020-03-14 DIAGNOSIS — G4733 Obstructive sleep apnea (adult) (pediatric): Secondary | ICD-10-CM | POA: Diagnosis not present

## 2020-03-22 DIAGNOSIS — H52223 Regular astigmatism, bilateral: Secondary | ICD-10-CM | POA: Diagnosis not present

## 2020-03-31 DIAGNOSIS — G4733 Obstructive sleep apnea (adult) (pediatric): Secondary | ICD-10-CM | POA: Diagnosis not present

## 2020-04-14 DIAGNOSIS — G4733 Obstructive sleep apnea (adult) (pediatric): Secondary | ICD-10-CM | POA: Diagnosis not present

## 2020-04-22 DIAGNOSIS — Z20822 Contact with and (suspected) exposure to covid-19: Secondary | ICD-10-CM | POA: Diagnosis not present

## 2020-04-24 DIAGNOSIS — R053 Chronic cough: Secondary | ICD-10-CM | POA: Diagnosis not present

## 2020-04-24 DIAGNOSIS — Z20828 Contact with and (suspected) exposure to other viral communicable diseases: Secondary | ICD-10-CM | POA: Diagnosis not present

## 2020-04-24 DIAGNOSIS — J069 Acute upper respiratory infection, unspecified: Secondary | ICD-10-CM | POA: Diagnosis not present

## 2020-04-24 DIAGNOSIS — R051 Acute cough: Secondary | ICD-10-CM | POA: Diagnosis not present

## 2020-05-14 DIAGNOSIS — G4733 Obstructive sleep apnea (adult) (pediatric): Secondary | ICD-10-CM | POA: Diagnosis not present

## 2020-06-14 DIAGNOSIS — G4733 Obstructive sleep apnea (adult) (pediatric): Secondary | ICD-10-CM | POA: Diagnosis not present

## 2020-07-14 DIAGNOSIS — G4733 Obstructive sleep apnea (adult) (pediatric): Secondary | ICD-10-CM | POA: Diagnosis not present

## 2020-08-14 DIAGNOSIS — G4733 Obstructive sleep apnea (adult) (pediatric): Secondary | ICD-10-CM | POA: Diagnosis not present

## 2020-09-08 DIAGNOSIS — R079 Chest pain, unspecified: Secondary | ICD-10-CM | POA: Diagnosis not present

## 2020-09-08 DIAGNOSIS — I371 Nonrheumatic pulmonary valve insufficiency: Secondary | ICD-10-CM | POA: Diagnosis not present

## 2020-09-08 DIAGNOSIS — G4733 Obstructive sleep apnea (adult) (pediatric): Secondary | ICD-10-CM | POA: Diagnosis not present

## 2020-09-08 DIAGNOSIS — Z9989 Dependence on other enabling machines and devices: Secondary | ICD-10-CM | POA: Diagnosis not present

## 2020-09-08 DIAGNOSIS — I517 Cardiomegaly: Secondary | ICD-10-CM | POA: Diagnosis not present

## 2020-09-08 DIAGNOSIS — I509 Heart failure, unspecified: Secondary | ICD-10-CM | POA: Diagnosis not present

## 2020-09-08 DIAGNOSIS — I4892 Unspecified atrial flutter: Secondary | ICD-10-CM | POA: Diagnosis not present

## 2020-09-08 DIAGNOSIS — I493 Ventricular premature depolarization: Secondary | ICD-10-CM | POA: Diagnosis not present

## 2020-09-08 DIAGNOSIS — R0789 Other chest pain: Secondary | ICD-10-CM | POA: Diagnosis not present

## 2020-09-08 DIAGNOSIS — I499 Cardiac arrhythmia, unspecified: Secondary | ICD-10-CM | POA: Diagnosis not present

## 2020-09-08 DIAGNOSIS — I447 Left bundle-branch block, unspecified: Secondary | ICD-10-CM | POA: Diagnosis not present

## 2020-09-08 DIAGNOSIS — E7849 Other hyperlipidemia: Secondary | ICD-10-CM | POA: Diagnosis not present

## 2020-09-08 DIAGNOSIS — R001 Bradycardia, unspecified: Secondary | ICD-10-CM | POA: Diagnosis not present

## 2020-09-08 DIAGNOSIS — I4430 Unspecified atrioventricular block: Secondary | ICD-10-CM | POA: Diagnosis not present

## 2020-09-08 DIAGNOSIS — F32A Depression, unspecified: Secondary | ICD-10-CM | POA: Diagnosis not present

## 2020-09-08 DIAGNOSIS — E785 Hyperlipidemia, unspecified: Secondary | ICD-10-CM | POA: Diagnosis not present

## 2020-09-08 DIAGNOSIS — R42 Dizziness and giddiness: Secondary | ICD-10-CM | POA: Diagnosis not present

## 2020-09-08 DIAGNOSIS — Z20822 Contact with and (suspected) exposure to covid-19: Secondary | ICD-10-CM | POA: Diagnosis not present

## 2020-09-08 DIAGNOSIS — Z8249 Family history of ischemic heart disease and other diseases of the circulatory system: Secondary | ICD-10-CM | POA: Diagnosis not present

## 2020-09-08 DIAGNOSIS — I483 Typical atrial flutter: Secondary | ICD-10-CM | POA: Diagnosis not present

## 2020-09-08 DIAGNOSIS — I4891 Unspecified atrial fibrillation: Secondary | ICD-10-CM | POA: Diagnosis not present

## 2020-09-08 DIAGNOSIS — I213 ST elevation (STEMI) myocardial infarction of unspecified site: Secondary | ICD-10-CM | POA: Diagnosis not present

## 2020-09-09 DIAGNOSIS — G4733 Obstructive sleep apnea (adult) (pediatric): Secondary | ICD-10-CM | POA: Diagnosis not present

## 2020-09-09 DIAGNOSIS — R079 Chest pain, unspecified: Secondary | ICD-10-CM | POA: Diagnosis not present

## 2020-09-09 DIAGNOSIS — I4892 Unspecified atrial flutter: Secondary | ICD-10-CM | POA: Diagnosis not present

## 2020-09-09 DIAGNOSIS — F32A Depression, unspecified: Secondary | ICD-10-CM | POA: Diagnosis not present

## 2020-09-09 DIAGNOSIS — R000 Tachycardia, unspecified: Secondary | ICD-10-CM | POA: Diagnosis not present

## 2020-09-09 DIAGNOSIS — R001 Bradycardia, unspecified: Secondary | ICD-10-CM | POA: Diagnosis not present

## 2020-09-09 DIAGNOSIS — Z9989 Dependence on other enabling machines and devices: Secondary | ICD-10-CM | POA: Diagnosis not present

## 2020-09-10 DIAGNOSIS — G4733 Obstructive sleep apnea (adult) (pediatric): Secondary | ICD-10-CM | POA: Diagnosis not present

## 2020-09-10 DIAGNOSIS — Z9989 Dependence on other enabling machines and devices: Secondary | ICD-10-CM | POA: Diagnosis not present

## 2020-09-10 DIAGNOSIS — F32A Depression, unspecified: Secondary | ICD-10-CM | POA: Diagnosis not present

## 2020-09-10 DIAGNOSIS — R001 Bradycardia, unspecified: Secondary | ICD-10-CM | POA: Diagnosis not present

## 2020-09-10 DIAGNOSIS — I4892 Unspecified atrial flutter: Secondary | ICD-10-CM | POA: Diagnosis not present

## 2020-09-10 DIAGNOSIS — R079 Chest pain, unspecified: Secondary | ICD-10-CM | POA: Diagnosis not present

## 2020-09-10 DIAGNOSIS — R000 Tachycardia, unspecified: Secondary | ICD-10-CM | POA: Diagnosis not present

## 2020-09-11 DIAGNOSIS — I4510 Unspecified right bundle-branch block: Secondary | ICD-10-CM | POA: Diagnosis not present

## 2020-09-11 DIAGNOSIS — R001 Bradycardia, unspecified: Secondary | ICD-10-CM | POA: Diagnosis not present

## 2020-09-14 DIAGNOSIS — G4733 Obstructive sleep apnea (adult) (pediatric): Secondary | ICD-10-CM | POA: Diagnosis not present

## 2020-09-27 DIAGNOSIS — I480 Paroxysmal atrial fibrillation: Secondary | ICD-10-CM | POA: Diagnosis not present

## 2020-09-28 DIAGNOSIS — R001 Bradycardia, unspecified: Secondary | ICD-10-CM | POA: Diagnosis not present

## 2020-10-12 DIAGNOSIS — G4733 Obstructive sleep apnea (adult) (pediatric): Secondary | ICD-10-CM | POA: Diagnosis not present

## 2020-10-24 DIAGNOSIS — Z20822 Contact with and (suspected) exposure to covid-19: Secondary | ICD-10-CM | POA: Diagnosis not present

## 2020-10-24 DIAGNOSIS — Z01812 Encounter for preprocedural laboratory examination: Secondary | ICD-10-CM | POA: Diagnosis not present

## 2020-10-24 DIAGNOSIS — I480 Paroxysmal atrial fibrillation: Secondary | ICD-10-CM | POA: Diagnosis not present

## 2020-10-27 DIAGNOSIS — Z7901 Long term (current) use of anticoagulants: Secondary | ICD-10-CM | POA: Diagnosis not present

## 2020-10-27 DIAGNOSIS — Z79899 Other long term (current) drug therapy: Secondary | ICD-10-CM | POA: Diagnosis not present

## 2020-10-27 DIAGNOSIS — R9431 Abnormal electrocardiogram [ECG] [EKG]: Secondary | ICD-10-CM | POA: Diagnosis not present

## 2020-10-27 DIAGNOSIS — I483 Typical atrial flutter: Secondary | ICD-10-CM | POA: Diagnosis not present

## 2020-10-27 DIAGNOSIS — I4510 Unspecified right bundle-branch block: Secondary | ICD-10-CM | POA: Diagnosis not present

## 2020-10-27 DIAGNOSIS — R319 Hematuria, unspecified: Secondary | ICD-10-CM | POA: Diagnosis not present

## 2020-10-27 DIAGNOSIS — I4892 Unspecified atrial flutter: Secondary | ICD-10-CM | POA: Diagnosis not present

## 2020-10-28 DIAGNOSIS — R319 Hematuria, unspecified: Secondary | ICD-10-CM | POA: Diagnosis not present

## 2020-10-28 DIAGNOSIS — I4510 Unspecified right bundle-branch block: Secondary | ICD-10-CM | POA: Diagnosis not present

## 2020-10-28 DIAGNOSIS — Z7901 Long term (current) use of anticoagulants: Secondary | ICD-10-CM | POA: Diagnosis not present

## 2020-10-28 DIAGNOSIS — I4892 Unspecified atrial flutter: Secondary | ICD-10-CM | POA: Diagnosis not present

## 2020-10-28 DIAGNOSIS — Z9889 Other specified postprocedural states: Secondary | ICD-10-CM | POA: Diagnosis not present

## 2020-10-28 DIAGNOSIS — I4891 Unspecified atrial fibrillation: Secondary | ICD-10-CM | POA: Diagnosis not present

## 2020-10-28 DIAGNOSIS — R9431 Abnormal electrocardiogram [ECG] [EKG]: Secondary | ICD-10-CM | POA: Diagnosis not present

## 2020-10-28 DIAGNOSIS — Z79899 Other long term (current) drug therapy: Secondary | ICD-10-CM | POA: Diagnosis not present

## 2020-11-06 DIAGNOSIS — D126 Benign neoplasm of colon, unspecified: Secondary | ICD-10-CM | POA: Diagnosis not present

## 2020-11-12 DIAGNOSIS — G4733 Obstructive sleep apnea (adult) (pediatric): Secondary | ICD-10-CM | POA: Diagnosis not present

## 2020-12-06 DIAGNOSIS — Z9889 Other specified postprocedural states: Secondary | ICD-10-CM | POA: Diagnosis not present

## 2020-12-06 DIAGNOSIS — I4891 Unspecified atrial fibrillation: Secondary | ICD-10-CM | POA: Diagnosis not present

## 2020-12-06 DIAGNOSIS — I480 Paroxysmal atrial fibrillation: Secondary | ICD-10-CM | POA: Diagnosis not present

## 2020-12-06 DIAGNOSIS — Z8679 Personal history of other diseases of the circulatory system: Secondary | ICD-10-CM | POA: Diagnosis not present

## 2020-12-08 DIAGNOSIS — G4733 Obstructive sleep apnea (adult) (pediatric): Secondary | ICD-10-CM | POA: Diagnosis not present

## 2020-12-12 DIAGNOSIS — G4733 Obstructive sleep apnea (adult) (pediatric): Secondary | ICD-10-CM | POA: Diagnosis not present

## 2020-12-19 DIAGNOSIS — F419 Anxiety disorder, unspecified: Secondary | ICD-10-CM | POA: Diagnosis not present

## 2020-12-19 DIAGNOSIS — Z6829 Body mass index (BMI) 29.0-29.9, adult: Secondary | ICD-10-CM | POA: Diagnosis not present

## 2020-12-19 DIAGNOSIS — I4892 Unspecified atrial flutter: Secondary | ICD-10-CM | POA: Diagnosis not present

## 2021-01-12 DIAGNOSIS — G4733 Obstructive sleep apnea (adult) (pediatric): Secondary | ICD-10-CM | POA: Diagnosis not present

## 2021-02-11 DIAGNOSIS — G4733 Obstructive sleep apnea (adult) (pediatric): Secondary | ICD-10-CM | POA: Diagnosis not present

## 2021-03-14 DIAGNOSIS — G4733 Obstructive sleep apnea (adult) (pediatric): Secondary | ICD-10-CM | POA: Diagnosis not present

## 2021-04-11 DIAGNOSIS — L814 Other melanin hyperpigmentation: Secondary | ICD-10-CM | POA: Diagnosis not present

## 2021-04-11 DIAGNOSIS — D1801 Hemangioma of skin and subcutaneous tissue: Secondary | ICD-10-CM | POA: Diagnosis not present

## 2021-04-11 DIAGNOSIS — D2239 Melanocytic nevi of other parts of face: Secondary | ICD-10-CM | POA: Diagnosis not present

## 2021-04-11 DIAGNOSIS — D225 Melanocytic nevi of trunk: Secondary | ICD-10-CM | POA: Diagnosis not present

## 2021-04-14 DIAGNOSIS — G4733 Obstructive sleep apnea (adult) (pediatric): Secondary | ICD-10-CM | POA: Diagnosis not present

## 2021-05-14 DIAGNOSIS — G4733 Obstructive sleep apnea (adult) (pediatric): Secondary | ICD-10-CM | POA: Diagnosis not present

## 2021-05-17 DIAGNOSIS — Z1211 Encounter for screening for malignant neoplasm of colon: Secondary | ICD-10-CM | POA: Diagnosis not present

## 2021-05-17 DIAGNOSIS — R1011 Right upper quadrant pain: Secondary | ICD-10-CM | POA: Diagnosis not present

## 2021-05-17 DIAGNOSIS — K5790 Diverticulosis of intestine, part unspecified, without perforation or abscess without bleeding: Secondary | ICD-10-CM | POA: Diagnosis not present

## 2021-06-07 DIAGNOSIS — N400 Enlarged prostate without lower urinary tract symptoms: Secondary | ICD-10-CM | POA: Diagnosis not present

## 2021-06-07 DIAGNOSIS — Z125 Encounter for screening for malignant neoplasm of prostate: Secondary | ICD-10-CM | POA: Diagnosis not present

## 2021-06-07 DIAGNOSIS — R102 Pelvic and perineal pain: Secondary | ICD-10-CM | POA: Diagnosis not present

## 2021-06-08 DIAGNOSIS — K5730 Diverticulosis of large intestine without perforation or abscess without bleeding: Secondary | ICD-10-CM | POA: Diagnosis not present

## 2021-06-08 DIAGNOSIS — Z86010 Personal history of colonic polyps: Secondary | ICD-10-CM | POA: Diagnosis not present

## 2021-06-08 DIAGNOSIS — K635 Polyp of colon: Secondary | ICD-10-CM | POA: Diagnosis not present

## 2021-06-08 DIAGNOSIS — D124 Benign neoplasm of descending colon: Secondary | ICD-10-CM | POA: Diagnosis not present

## 2021-06-08 DIAGNOSIS — Z1211 Encounter for screening for malignant neoplasm of colon: Secondary | ICD-10-CM | POA: Diagnosis not present

## 2021-06-08 DIAGNOSIS — D126 Benign neoplasm of colon, unspecified: Secondary | ICD-10-CM | POA: Diagnosis not present

## 2021-06-14 DIAGNOSIS — G4733 Obstructive sleep apnea (adult) (pediatric): Secondary | ICD-10-CM | POA: Diagnosis not present

## 2021-07-10 DIAGNOSIS — Z1159 Encounter for screening for other viral diseases: Secondary | ICD-10-CM | POA: Diagnosis not present

## 2021-07-10 DIAGNOSIS — U071 COVID-19: Secondary | ICD-10-CM | POA: Diagnosis not present

## 2021-07-14 DIAGNOSIS — G4733 Obstructive sleep apnea (adult) (pediatric): Secondary | ICD-10-CM | POA: Diagnosis not present

## 2021-08-09 DIAGNOSIS — G4733 Obstructive sleep apnea (adult) (pediatric): Secondary | ICD-10-CM | POA: Diagnosis not present

## 2021-08-14 DIAGNOSIS — G4733 Obstructive sleep apnea (adult) (pediatric): Secondary | ICD-10-CM | POA: Diagnosis not present

## 2021-08-30 DIAGNOSIS — H6591 Unspecified nonsuppurative otitis media, right ear: Secondary | ICD-10-CM | POA: Diagnosis not present

## 2021-08-30 DIAGNOSIS — Z6828 Body mass index (BMI) 28.0-28.9, adult: Secondary | ICD-10-CM | POA: Diagnosis not present

## 2021-08-30 DIAGNOSIS — F419 Anxiety disorder, unspecified: Secondary | ICD-10-CM | POA: Diagnosis not present

## 2021-09-14 DIAGNOSIS — G4733 Obstructive sleep apnea (adult) (pediatric): Secondary | ICD-10-CM | POA: Diagnosis not present

## 2021-10-12 DIAGNOSIS — G4733 Obstructive sleep apnea (adult) (pediatric): Secondary | ICD-10-CM | POA: Diagnosis not present

## 2021-11-12 DIAGNOSIS — G4733 Obstructive sleep apnea (adult) (pediatric): Secondary | ICD-10-CM | POA: Diagnosis not present

## 2021-11-16 DIAGNOSIS — N4341 Spermatocele of epididymis, single: Secondary | ICD-10-CM | POA: Diagnosis not present

## 2021-12-12 DIAGNOSIS — G4733 Obstructive sleep apnea (adult) (pediatric): Secondary | ICD-10-CM | POA: Diagnosis not present

## 2022-01-12 DIAGNOSIS — G4733 Obstructive sleep apnea (adult) (pediatric): Secondary | ICD-10-CM | POA: Diagnosis not present
